# Patient Record
Sex: Male | Born: 1935 | Race: Black or African American | Hispanic: No | Smoking: Former smoker
Health system: Southern US, Community
[De-identification: ages and names within clinical notes are randomized; demographics above are authoritative.]

## PROBLEM LIST (undated history)

## (undated) DIAGNOSIS — I10 Essential (primary) hypertension: Secondary | ICD-10-CM

---

## 2003-04-11 ENCOUNTER — Other Ambulatory Visit: Payer: Self-pay

## 2004-08-05 ENCOUNTER — Emergency Department: Payer: Self-pay | Admitting: Emergency Medicine

## 2017-04-26 ENCOUNTER — Emergency Department
Admission: EM | Admit: 2017-04-26 | Discharge: 2017-04-26 | Disposition: A | Discharge status: Home / Self Care | Payer: Medicare HMO | Attending: Emergency Medicine | Admitting: Emergency Medicine

## 2017-04-26 ENCOUNTER — Other Ambulatory Visit: Payer: Self-pay

## 2017-04-26 ENCOUNTER — Encounter: Payer: Self-pay | Admitting: Emergency Medicine

## 2017-04-26 DIAGNOSIS — E871 Hypo-osmolality and hyponatremia: Secondary | ICD-10-CM | POA: Insufficient documentation

## 2017-04-26 DIAGNOSIS — R232 Flushing: Secondary | ICD-10-CM | POA: Diagnosis present

## 2017-04-26 DIAGNOSIS — Z87891 Personal history of nicotine dependence: Secondary | ICD-10-CM | POA: Insufficient documentation

## 2017-04-26 DIAGNOSIS — E876 Hypokalemia: Secondary | ICD-10-CM | POA: Insufficient documentation

## 2017-04-26 DIAGNOSIS — R030 Elevated blood-pressure reading, without diagnosis of hypertension: Secondary | ICD-10-CM | POA: Diagnosis not present

## 2017-04-26 LAB — CBC WITH DIFFERENTIAL/PLATELET
BASOS ABS: 0 10*3/uL (ref 0–0.1)
BASOS PCT: 1 %
EOS ABS: 0.1 10*3/uL (ref 0–0.7)
Eosinophils Relative: 3 %
HCT: 38.2 % — ABNORMAL LOW (ref 40.0–52.0)
HEMOGLOBIN: 13.1 g/dL (ref 13.0–18.0)
Lymphocytes Relative: 18 %
Lymphs Abs: 0.7 10*3/uL — ABNORMAL LOW (ref 1.0–3.6)
MCH: 28.6 pg (ref 26.0–34.0)
MCHC: 34.2 g/dL (ref 32.0–36.0)
MCV: 83.6 fL (ref 80.0–100.0)
MONOS PCT: 11 %
Monocytes Absolute: 0.4 10*3/uL (ref 0.2–1.0)
NEUTROS PCT: 67 %
Neutro Abs: 2.6 10*3/uL (ref 1.4–6.5)
Platelets: 145 10*3/uL — ABNORMAL LOW (ref 150–440)
RBC: 4.58 MIL/uL (ref 4.40–5.90)
RDW: 15.3 % — ABNORMAL HIGH (ref 11.5–14.5)
WBC: 3.9 10*3/uL (ref 3.8–10.6)

## 2017-04-26 LAB — COMPREHENSIVE METABOLIC PANEL
ALBUMIN: 4 g/dL (ref 3.5–5.0)
ALK PHOS: 86 U/L (ref 38–126)
ALT: 15 U/L — ABNORMAL LOW (ref 17–63)
ANION GAP: 9 (ref 5–15)
AST: 23 U/L (ref 15–41)
BUN: 18 mg/dL (ref 6–20)
CALCIUM: 8.7 mg/dL — AB (ref 8.9–10.3)
CO2: 25 mmol/L (ref 22–32)
Chloride: 99 mmol/L — ABNORMAL LOW (ref 101–111)
Creatinine, Ser: 0.89 mg/dL (ref 0.61–1.24)
GFR calc Af Amer: >60 mL/min (ref >60)
GFR calc non Af Amer: >60 mL/min (ref >60)
GLUCOSE: 103 mg/dL — AB (ref 65–99)
Potassium: 3.3 mmol/L — ABNORMAL LOW (ref 3.5–5.1)
SODIUM: 133 mmol/L — AB (ref 135–145)
Total Bilirubin: 1.4 mg/dL — ABNORMAL HIGH (ref 0.3–1.2)
Total Protein: 7.3 g/dL (ref 6.5–8.1)

## 2017-04-26 LAB — URINALYSIS, COMPLETE (UACMP) WITH MICROSCOPIC
Bilirubin Urine: NEGATIVE
Glucose, UA: NEGATIVE mg/dL
Hgb urine dipstick: NEGATIVE
KETONES UR: NEGATIVE mg/dL
LEUKOCYTES UA: NEGATIVE
Nitrite: NEGATIVE
PROTEIN: NEGATIVE mg/dL
Specific Gravity, Urine: 1.01 (ref 1.005–1.030)
pH: 7 (ref 5.0–8.0)

## 2017-04-26 LAB — TROPONIN I: Troponin I: <0.03 ng/mL (ref <0.03)

## 2017-04-26 MED ORDER — SODIUM CHLORIDE 0.9 % IV BOLUS (SEPSIS)
1000.0000 mL | Freq: Once | INTRAVENOUS | Status: AC
  Administered 2017-04-26: 1000 mL via INTRAVENOUS

## 2017-04-26 NOTE — Discharge Instructions (Signed)
Follow-up with your regular doctor if you are not better in 3 days.  Take 2 of your potassium pills a day instead of 1.  Please eat some salty foods to increase your sodium level.  Something like potato chips or add some salt to your food for the next few days.  If you are worsening please return to the emergency department.  If you just or not better see your doctor.

## 2017-04-26 NOTE — ED Triage Notes (Signed)
pt presents to ED accompanied by his son. Pt states he had gone to sleep this afternoon around 1600 and woke up at 1830 "feeling too hot" and his "eyes felt heavy". Pt denies pain. No cough, congestion, weakness, or fever. Pt currently has no other complaints. Pt states he knows he needs to drink more water because his "throat feels dry". Pt answering questions without difficulty at this time. No increased work of breathing or acute distress noted.  Pt states other then feeling dry in his mouth he is feeling all right.

## 2017-04-26 NOTE — ED Provider Notes (Signed)
[facility] Emergency Department Provider Note  ____________________________________________   First MD Initiated Contact with Patient 04/26/17 2126     (approximate)  I have reviewed the triage vital signs and the nursing notes.   HISTORY  Chief Complaint Hot Flashes    HPI Kenneth Butler is a 84 y.o. male who complains of feeling hot.  He states he went home and fell asleep on the side of his eyes felt heavy and then he woke up feeling like he was too hot.  He denies chest pain, shortness of breath, cold symptoms, vomiting or diarrhea.  He states he has been able to urinate without difficulty.  His family members have not noticed that he is acting any differently.  History reviewed. No pertinent past medical history.  There are no active problems to display for this patient.   History reviewed. No pertinent surgical history.  Prior to Admission medications   Not on File    Allergies Patient has no known allergies.  No family history on file.  Social History Social History   Tobacco Use  . Smoking status: Former Research scientist (life sciences)  . Smokeless tobacco: Never Used  Substance Use Topics  . Alcohol use: No    Frequency: Never  . Drug use: No    Review of Systems  Constitutional: No fever/chills, patient states he felt warm Eyes: No visual changes. ENT: No sore throat.  Denies runny nose or congestion Respiratory: Denies cough Cardiovascular: Denies chest pain or shortness of breath Gastrointestinal: Denies vomiting or diarrhea Genitourinary: Negative for dysuria. Musculoskeletal: Negative for back pain. Skin: Negative for rash.    ____________________________________________   PHYSICAL EXAM:  VITAL SIGNS: ED Triage Vitals  Enc Vitals Group     BP 04/26/17 2027 (!) 160/59     Pulse Rate 04/26/17 2027 69     Resp 04/26/17 2027 16     Temp 04/26/17 2027 98.9 F (37.2 C)     Temp Source 04/26/17 2027 Oral     SpO2 04/26/17 2027 98 %       Weight 04/26/17 2035 203 lb (92.1 kg)     Height 04/26/17 2035 '5\' 6"'$  (1.676 m)     Head Circumference --      Peak Flow --      Pain Score --      Pain Loc --      Pain Edu? --      Excl. in [place]? --     Constitutional: Alert and oriented. Well appearing and in no acute distress.  All questions in an appropriate manner Eyes: Conjunctivae are normal.  Head: Atraumatic. Nose: No congestion/rhinnorhea. Mouth/Throat: Mucous membranes are moist.  Throat is normal Cardiovascular: Normal rate, regular rhythm.  Heart sounds are normal Respiratory: Normal respiratory effort.  No retractions, lungs are clear to auscultation Abdomen: Is soft, nontender, bowel sounds are normal GU: deferred Musculoskeletal: FROM all extremities, warm and well perfused Neurologic:  Normal speech and language.  Skin:  Skin is warm, dry and intact. No rash noted. Psychiatric: Mood and affect are normal. Speech and behavior are normal.  ____________________________________________   LABS (all labs ordered are listed, but only abnormal results are displayed)  Labs Reviewed  CBC WITH DIFFERENTIAL/PLATELET - Abnormal; Notable for the following components:      Result Value   HCT 38.2 (*)    RDW 15.3 (*)    Platelets 145 (*)    Lymphs Abs 0.7 (*)    All other components within  normal limits  COMPREHENSIVE METABOLIC PANEL - Abnormal; Notable for the following components:   Sodium 133 (*)    Potassium 3.3 (*)    Chloride 99 (*)    Glucose, Bld 103 (*)    Calcium 8.7 (*)    ALT 15 (*)    Total Bilirubin 1.4 (*)    All other components within normal limits  URINALYSIS, COMPLETE (UACMP) WITH MICROSCOPIC - Abnormal; Notable for the following components:   Color, Urine YELLOW (*)    APPearance CLEAR (*)    Bacteria, UA RARE (*)    Squamous Epithelial / LPF 0-5 (*)    All other components within normal limits  TROPONIN I    ____________________________________________   ____________________________________________  RADIOLOGY    ____________________________________________   PROCEDURES  Procedure(s) performed: Saline lock, normal saline bolus 1 L  Procedures    ____________________________________________   INITIAL IMPRESSION / ASSESSMENT AND PLAN / ED COURSE  Pertinent labs & imaging results that were available during my care of the patient were reviewed by me and considered in my medical decision making (see chart for details).  Patient is an 84 year old male who presents emergency department because his eyes felt heavy and he felt hot.  He has no other complaints.  The family has not noticed him acting any differently.  Physical exam he appears well but elderly.  He does not move easily.  The remainder of the exam is benign  Labs are ordered, EKG is ordered, fluids were ordered  Patient CBC is normal, potassium is low at 3.3, his urinalysis is normal, EKG was ordered due to the low potassium, EKG is negative for STEMI.  EKG was read by Dr. Archie Balboa  Test results and EKG results were discussed with the family.  The patient already takes 16 M EQ's of potassium.  I instructed him to increase it to 20 M EQ's by taking 2 pills a day.  He states his doctor told him he might end up on 2 pills a day.  He is also eat some salty foods as his sodium is low.  Patient states he understands and will comply.  He was discharged in the care of his family.  They are to return if he is worsening.  He was discharged in stable condition     As part of my medical decision making, I reviewed the following data within the [place] History obtained from family, Nursing notes reviewed and incorporated, Labs reviewed CBC, met C showed low potassium and low sodium.  Troponin is negative.  Urinalysis is negative, EKG interpreted bradycardia, EKG was read by Dr. Archie Balboa, Old chart reviewed, Notes from  prior ED visits and [place] Controlled Substance Database  ____________________________________________   FINAL CLINICAL IMPRESSION(S) / ED DIAGNOSES  Final diagnoses:  Hypokalemia  Hyponatremia      NEW MEDICATIONS STARTED DURING THIS VISIT:  New Prescriptions   No medications on file     Note:  This document was prepared using Dragon voice recognition software and may include unintentional dictation errors.    Versie Starks, PA-C 04/26/17 2318    Harvest Dark, MD 04/26/17 [phone]

## 2017-04-26 NOTE — ED Triage Notes (Signed)
Pt reports woke up from a nap and felt warm

## 2017-04-26 NOTE — ED Notes (Signed)
Pts family states he woke up not feeling very well. He states he has had a little diarrhea and hasnt been drinking enough water. No c/o chest pain or fevers

## 2018-04-15 ENCOUNTER — Emergency Department: Payer: Medicare Other

## 2018-04-15 ENCOUNTER — Inpatient Hospital Stay
Admission: EM | Admit: 2018-04-15 | Discharge: 2018-04-16 | DRG: 193 | Disposition: A | Discharge status: Home Health | Payer: Medicare Other | Attending: Internal Medicine | Admitting: Internal Medicine

## 2018-04-15 ENCOUNTER — Encounter: Payer: Self-pay | Admitting: *Deleted

## 2018-04-15 ENCOUNTER — Other Ambulatory Visit: Payer: Self-pay

## 2018-04-15 DIAGNOSIS — M1712 Unilateral primary osteoarthritis, left knee: Secondary | ICD-10-CM | POA: Diagnosis not present

## 2018-04-15 DIAGNOSIS — E876 Hypokalemia: Secondary | ICD-10-CM | POA: Diagnosis present

## 2018-04-15 DIAGNOSIS — R4182 Altered mental status, unspecified: Secondary | ICD-10-CM | POA: Diagnosis present

## 2018-04-15 DIAGNOSIS — N390 Urinary tract infection, site not specified: Secondary | ICD-10-CM

## 2018-04-15 DIAGNOSIS — F0390 Unspecified dementia without behavioral disturbance: Secondary | ICD-10-CM | POA: Diagnosis present

## 2018-04-15 DIAGNOSIS — R778 Other specified abnormalities of plasma proteins: Secondary | ICD-10-CM

## 2018-04-15 DIAGNOSIS — Z87891 Personal history of nicotine dependence: Secondary | ICD-10-CM

## 2018-04-15 DIAGNOSIS — I10 Essential (primary) hypertension: Secondary | ICD-10-CM | POA: Diagnosis present

## 2018-04-15 DIAGNOSIS — J101 Influenza due to other identified influenza virus with other respiratory manifestations: Secondary | ICD-10-CM

## 2018-04-15 DIAGNOSIS — I248 Other forms of acute ischemic heart disease: Secondary | ICD-10-CM | POA: Diagnosis not present

## 2018-04-15 DIAGNOSIS — R7989 Other specified abnormal findings of blood chemistry: Secondary | ICD-10-CM

## 2018-04-15 DIAGNOSIS — R531 Weakness: Secondary | ICD-10-CM

## 2018-04-15 DIAGNOSIS — A419 Sepsis, unspecified organism: Secondary | ICD-10-CM

## 2018-04-15 DIAGNOSIS — A4189 Other specified sepsis: Secondary | ICD-10-CM | POA: Diagnosis present

## 2018-04-15 DIAGNOSIS — R509 Fever, unspecified: Secondary | ICD-10-CM

## 2018-04-15 HISTORY — DX: Essential (primary) hypertension: I10

## 2018-04-15 LAB — CBC WITH DIFFERENTIAL/PLATELET
ABS IMMATURE GRANULOCYTES: 0.02 10*3/uL (ref 0.00–0.07)
Basophils Absolute: 0 10*3/uL (ref 0.0–0.1)
Basophils Relative: 1 %
Eosinophils Absolute: 0 10*3/uL (ref 0.0–0.5)
Eosinophils Relative: 0 %
HEMATOCRIT: 39.7 % (ref 39.0–52.0)
Hemoglobin: 13 g/dL (ref 13.0–17.0)
IMMATURE GRANULOCYTES: 1 %
LYMPHS ABS: 0.1 10*3/uL — AB (ref 0.7–4.0)
LYMPHS PCT: 3 %
MCH: 28.8 pg (ref 26.0–34.0)
MCHC: 32.7 g/dL (ref 30.0–36.0)
MCV: 87.8 fL (ref 80.0–100.0)
MONOS PCT: 10 %
Monocytes Absolute: 0.5 10*3/uL (ref 0.1–1.0)
NEUTROS ABS: 3.7 10*3/uL (ref 1.7–7.7)
NEUTROS PCT: 85 %
Platelets: 144 10*3/uL — ABNORMAL LOW (ref 150–400)
RBC: 4.52 MIL/uL (ref 4.22–5.81)
RDW: 13.9 % (ref 11.5–15.5)
WBC: 4.3 10*3/uL (ref 4.0–10.5)
nRBC: 0 % (ref 0.0–0.2)

## 2018-04-15 LAB — COMPREHENSIVE METABOLIC PANEL
ALBUMIN: 3.8 g/dL (ref 3.5–5.0)
ALT: 17 U/L (ref 0–44)
AST: 45 U/L — AB (ref 15–41)
Alkaline Phosphatase: 61 U/L (ref 38–126)
Anion gap: 10 (ref 5–15)
BILIRUBIN TOTAL: 1.9 mg/dL — AB (ref 0.3–1.2)
BUN: 12 mg/dL (ref 8–23)
CO2: 27 mmol/L (ref 22–32)
CREATININE: 1.02 mg/dL (ref 0.61–1.24)
Calcium: 9 mg/dL (ref 8.9–10.3)
Chloride: 100 mmol/L (ref 98–111)
GFR calc Af Amer: >60 mL/min (ref >60)
GLUCOSE: 131 mg/dL — AB (ref 70–99)
Potassium: 2.9 mmol/L — ABNORMAL LOW (ref 3.5–5.1)
Sodium: 137 mmol/L (ref 135–145)
Total Protein: 7.2 g/dL (ref 6.5–8.1)

## 2018-04-15 LAB — URINALYSIS, COMPLETE (UACMP) WITH MICROSCOPIC
BACTERIA UA: NONE SEEN
BILIRUBIN URINE: NEGATIVE
Glucose, UA: NEGATIVE mg/dL
KETONES UR: NEGATIVE mg/dL
NITRITE: NEGATIVE
PROTEIN: NEGATIVE mg/dL
Specific Gravity, Urine: 1.016 (ref 1.005–1.030)
pH: 5 (ref 5.0–8.0)

## 2018-04-15 LAB — LACTIC ACID, PLASMA
Lactic Acid, Venous: 2.9 mmol/L (ref 0.5–1.9)
Lactic Acid, Venous: 1.6 mmol/L (ref 0.5–1.9)

## 2018-04-15 LAB — TROPONIN I
TROPONIN I: 0.23 ng/mL — AB (ref <0.03)
Troponin I: 0.17 ng/mL (ref <0.03)
Troponin I: 0.2 ng/mL (ref <0.03)
Troponin I: 0.19 ng/mL (ref <0.03)

## 2018-04-15 LAB — INFLUENZA PANEL BY PCR (TYPE A & B)
INFLAPCR: POSITIVE — AB
Influenza B By PCR: NEGATIVE

## 2018-04-15 LAB — TSH: TSH: 0.935 u[IU]/mL (ref 0.350–4.500)

## 2018-04-15 MED ORDER — ONDANSETRON HCL 4 MG/2ML IJ SOLN: 4.0000 mg | Freq: Four times a day (QID) | INTRAMUSCULAR | Status: DC | PRN

## 2018-04-15 MED ORDER — SODIUM CHLORIDE 0.9 % IV BOLUS (SEPSIS)
1000.0000 mL | Freq: Once | INTRAVENOUS | Status: AC
  Administered 2018-04-15: 1000 mL via INTRAVENOUS

## 2018-04-15 MED ORDER — ENOXAPARIN SODIUM 40 MG/0.4ML ~~LOC~~ SOLN
40.0000 mg | SUBCUTANEOUS | Status: DC
  Administered 2018-04-15: 40 mg via SUBCUTANEOUS
  Filled 2018-04-15: qty 0.4

## 2018-04-15 MED ORDER — ATENOLOL 50 MG PO TABS
50.0000 mg | ORAL_TABLET | Freq: Every day | ORAL | Status: DC
  Administered 2018-04-15 – 2018-04-16 (×2): 50 mg via ORAL
  Filled 2018-04-15 (×2): qty 1

## 2018-04-15 MED ORDER — DOCUSATE SODIUM 100 MG PO CAPS
100.0000 mg | ORAL_CAPSULE | Freq: Two times a day (BID) | ORAL | Status: DC
  Administered 2018-04-15 – 2018-04-16 (×2): 100 mg via ORAL
  Filled 2018-04-15 (×3): qty 1

## 2018-04-15 MED ORDER — SODIUM CHLORIDE 0.9 % IV BOLUS (SEPSIS)
500.0000 mL | Freq: Once | INTRAVENOUS | Status: AC
  Administered 2018-04-15: 500 mL via INTRAVENOUS

## 2018-04-15 MED ORDER — ACETAMINOPHEN 500 MG PO TABS
1000.0000 mg | ORAL_TABLET | Freq: Once | ORAL | Status: AC
  Administered 2018-04-15: 1000 mg via ORAL
  Filled 2018-04-15: qty 2

## 2018-04-15 MED ORDER — ACETAMINOPHEN 325 MG PO TABS
650.0000 mg | ORAL_TABLET | Freq: Four times a day (QID) | ORAL | Status: DC | PRN
  Administered 2018-04-15: 650 mg via ORAL
  Filled 2018-04-15: qty 2

## 2018-04-15 MED ORDER — SODIUM CHLORIDE 0.9 % IV SOLN
1.0000 g | INTRAVENOUS | Status: DC
  Administered 2018-04-15: 1 g via INTRAVENOUS
  Filled 2018-04-15 (×2): qty 10

## 2018-04-15 MED ORDER — ACETAMINOPHEN 650 MG RE SUPP: 650.0000 mg | Freq: Four times a day (QID) | RECTAL | Status: DC | PRN

## 2018-04-15 MED ORDER — ONDANSETRON HCL 4 MG PO TABS: 4.0000 mg | ORAL_TABLET | Freq: Four times a day (QID) | ORAL | Status: DC | PRN

## 2018-04-15 MED ORDER — ATENOLOL-CHLORTHALIDONE 50-25 MG PO TABS: 1.0000 | ORAL_TABLET | Freq: Every day | ORAL | Status: DC

## 2018-04-15 MED ORDER — POTASSIUM CHLORIDE CRYS ER 10 MEQ PO TBCR
10.0000 meq | EXTENDED_RELEASE_TABLET | Freq: Two times a day (BID) | ORAL | Status: DC
  Administered 2018-04-15 – 2018-04-16 (×3): 10 meq via ORAL
  Filled 2018-04-15 (×3): qty 1

## 2018-04-15 MED ORDER — CHLORTHALIDONE 25 MG PO TABS
25.0000 mg | ORAL_TABLET | Freq: Every day | ORAL | Status: DC
  Administered 2018-04-15 – 2018-04-16 (×2): 25 mg via ORAL
  Filled 2018-04-15 (×3): qty 1

## 2018-04-15 MED ORDER — POTASSIUM CHLORIDE CRYS ER 20 MEQ PO TBCR
40.0000 meq | EXTENDED_RELEASE_TABLET | ORAL | Status: AC
  Administered 2018-04-15: 40 meq via ORAL
  Filled 2018-04-15: qty 2

## 2018-04-15 MED ORDER — SODIUM CHLORIDE 0.9 % IV SOLN
INTRAVENOUS | Status: DC
  Administered 2018-04-15 (×2): via INTRAVENOUS

## 2018-04-15 MED ORDER — OSELTAMIVIR PHOSPHATE 75 MG PO CAPS
75.0000 mg | ORAL_CAPSULE | Freq: Two times a day (BID) | ORAL | Status: DC
  Administered 2018-04-15 – 2018-04-16 (×4): 75 mg via ORAL
  Filled 2018-04-15 (×4): qty 1

## 2018-04-15 MED ORDER — ASPIRIN 81 MG PO CHEW
324.0000 mg | CHEWABLE_TABLET | Freq: Once | ORAL | Status: AC
  Administered 2018-04-15: 324 mg via ORAL
  Filled 2018-04-15: qty 4

## 2018-04-15 NOTE — ED Notes (Signed)
Pt was in no distress, denies needed anything.  I noted that his depend was soiled and his gown and sheet was wet.  Pt changed into new depend, cleaned pt who had had a large BM up and placed in new gown and new sheets after bedbath.  Warm blankets given for comfort.  Some ice water offered.  Pt ate a [place] breakfast his family brought in

## 2018-04-15 NOTE — Progress Notes (Signed)
Patient ID: Kenneth Butler, male   DOB: 21-Jan-1931, 85 y.o.   MRN: [id number]  Patient seen chart reviewed. Admitted with generalized weakness and fever along with mild tachycardia. Noted to have influenza A positive. Started on Tamiflu. Received one dose of Rocephin in the ER. UA not impressive for UTI chest x-ray negative for pneumonia. I will DC antibiotic. Continue treatment of flu with symptomatic management and PRN Tylenol. Encourage oral fluids. Physical therapy if needed.

## 2018-04-15 NOTE — Plan of Care (Signed)
  Problem: Fluid Volume: Goal: Hemodynamic stability will improve Outcome: Progressing   Problem: Clinical Measurements: Goal: Diagnostic test results will improve Outcome: Progressing   Problem: Clinical Measurements: Goal: Signs and symptoms of infection will decrease Outcome: Not Progressing Note:  Patient positive for Flu A. Remains in droplet isolation. Will continue to monitor.   Problem: Clinical Measurements: Goal: Signs and symptoms of infection will decrease Outcome: Not Progressing Note:  Patient positive for Flu A. Remains in droplet isolation. Will continue to monitor.

## 2018-04-15 NOTE — H&P (Signed)
Kenneth Butler is an 85 y.o. male.   Chief Complaint: Weakness HPI: The patient with past medical history of hypertension presents to the emergency department via EMS from home complaining of weakness.  The patient also told EMS that he had a headache.  He is unable to provide further details.  He was found to be febrile as well as tachycardic.  Laboratory evaluation revealed hypokalemia as well as elevated troponin and increased lactic acid.  Sepsis protocol was initiated prior to the emergency department staff calling the hospitalist service for admission.  Past Medical History:  Diagnosis Date  . HTN (hypertension)   Possible dementia as well  No past surgical history on file.  The patient is somnolent and either does not remember surgeries or does not want to cooperate with interview  No family history on file.  The patient is somnolent and does not appear to want to participate with questions regarding family medical history  Social History:  reports that he has quit smoking. He has never used smokeless tobacco. He reports that he does not drink alcohol or use drugs.  Allergies: No Known Allergies  Prior to Admission medications   Medication Sig Start Date End Date Taking? Authorizing Provider  atenolol-chlorthalidone (TENORETIC) 50-25 MG tablet Take 1 tablet by mouth daily. 02/07/18  Yes [provider]  potassium chloride (K-DUR,KLOR-CON) 10 MEQ tablet Take 10 mEq by mouth 2 (two) times daily. 10/25/17  Yes [provider]     Results for orders placed or performed during the hospital encounter of 04/15/18 (from the past 48 hour(s))  Urinalysis, Complete w Microscopic     Status: Abnormal   Collection Time: 04/15/18 12:12 AM  Result Value Ref Range   Color, Urine YELLOW (A) YELLOW   APPearance CLEAR (A) CLEAR   Specific Gravity, Urine 1.016 1.005 - 1.030   pH 5.0 5.0 - 8.0   Glucose, UA NEGATIVE NEGATIVE mg/dL   Hgb urine dipstick MODERATE (A) NEGATIVE   Bilirubin Urine NEGATIVE NEGATIVE   Ketones, ur NEGATIVE NEGATIVE mg/dL   Protein, ur NEGATIVE NEGATIVE mg/dL   Nitrite NEGATIVE NEGATIVE   Leukocytes, UA TRACE (A) NEGATIVE   RBC / HPF 0-5 0 - 5 RBC/hpf   WBC, UA 6-10 0 - 5 WBC/hpf   Bacteria, UA NONE SEEN NONE SEEN   Squamous Epithelial / LPF 0-5 0 - 5   Mucus PRESENT     Comment: Performed at [facility], [address] [place], [place] [address]  Culture, blood (routine x 2)     Status: None (Preliminary result)   Collection Time: 04/15/18 12:16 AM  Result Value Ref Range   Specimen Description BLOOD LEFT ASSIST CONTROL    Special Requests      BOTTLES DRAWN AEROBIC AND ANAEROBIC Blood Culture adequate volume   Culture      NO GROWTH < 12 HOURS Performed at [facility], [address] [place], [place] [address]    Report Status PENDING   Culture, blood (routine x 2)     Status: None (Preliminary result)   Collection Time: 04/15/18 12:17 AM  Result Value Ref Range   Specimen Description BLOOD BLOOD RIGHT FOREARM    Special Requests      BOTTLES DRAWN AEROBIC AND ANAEROBIC Blood Culture adequate volume   Culture      NO GROWTH < 12 HOURS Performed at [facility], [address], [place], [place] [address]    Report Status PENDING   CBC  with Differential     Status: Abnormal   Collection Time: 04/15/18 12:17 AM  Result Value Ref Range   WBC 4.3 4.0 - 10.5 K/uL   RBC 4.52 4.22 - 5.81 MIL/uL   Hemoglobin 13.0 13.0 - 17.0 g/dL   HCT 16.1 09.6 - 04.5 %   MCV 87.8 80.0 - 100.0 fL   MCH 28.8 26.0 - 34.0 pg   MCHC 32.7 30.0 - 36.0 g/dL   RDW 40.9 81.1 - 91.4 %   Platelets 144 (L) 150 - 400 K/uL   nRBC 0.0 0.0 - 0.2 %   Neutrophils Relative % 85 %   Neutro Abs 3.7 1.7 - 7.7 K/uL   Lymphocytes Relative 3 %   Lymphs Abs 0.1 (L) 0.7 - 4.0 K/uL   Monocytes Relative 10 %   Monocytes Absolute 0.5 0.1 - 1.0 K/uL   Eosinophils Relative 0 %   Eosinophils Absolute 0.0 0.0 - 0.5 K/uL   Basophils  Relative 1 %   Basophils Absolute 0.0 0.0 - 0.1 K/uL   Immature Granulocytes 1 %   Abs Immature Granulocytes 0.02 0.00 - 0.07 K/uL    Comment: Performed at [facility], [address] [place], [place] [address]  Comprehensive metabolic panel     Status: Abnormal   Collection Time: 04/15/18 12:17 AM  Result Value Ref Range   Sodium 137 135 - 145 mmol/L   Potassium 2.9 (L) 3.5 - 5.1 mmol/L   Chloride 100 98 - 111 mmol/L   CO2 27 22 - 32 mmol/L   Glucose, Bld 131 (H) 70 - 99 mg/dL   BUN 12 8 - 23 mg/dL   Creatinine, Ser 6.21 0.61 - 1.24 mg/dL   Calcium 9.0 8.9 - 30.8 mg/dL   Total Protein 7.2 6.5 - 8.1 g/dL   Albumin 3.8 3.5 - 5.0 g/dL   AST 45 (H) 15 - 41 U/L   ALT 17 0 - 44 U/L   Alkaline Phosphatase 61 38 - 126 U/L   Total Bilirubin 1.9 (H) 0.3 - 1.2 mg/dL   GFR calc non Af Amer >60 >60 mL/min   GFR calc Af Amer >60 >60 mL/min   Anion gap 10 5 - 15    Comment: Performed at [facility], [address] [place], [place] [address]  Troponin I - Once     Status: Abnormal   Collection Time: 04/15/18 12:17 AM  Result Value Ref Range   Troponin I 0.23 (HH) <0.03 ng/mL    Comment: CRITICAL RESULT CALLED TO, READ BACK BY AND VERIFIED WITH AMY COWENS @0112  04/15/2018 TTG Performed at [facility], [address] [place], [place] [address]   Lactic acid, plasma     Status: Abnormal   Collection Time: 04/15/18 12:17 AM  Result Value Ref Range   Lactic Acid, Venous 2.9 (HH) 0.5 - 1.9 mmol/L    Comment: CRITICAL RESULT CALLED TO, READ BACK BY AND VERIFIED WITH AMY COWENS @0112  04/15/2018 TTG Performed at [facility], [address] [place], [place] [address]   Influenza panel by PCR (type A & B)     Status: Abnormal   Collection Time: 04/15/18 12:17 AM  Result Value Ref Range   Influenza A By PCR POSITIVE (A) NEGATIVE   Influenza B By PCR NEGATIVE NEGATIVE    Comment: (NOTE) The Xpert Xpress Flu assay is intended as an aid in the diagnosis  of  influenza and should not be used as a sole basis  for treatment.  This  assay is FDA approved for nasopharyngeal swab specimens only. Nasal  washings and aspirates are unacceptable for Xpert Xpress Flu testing. Performed at [facility], [address] [place], [place] [address]   Lactic acid, plasma     Status: None   Collection Time: 04/15/18  3:23 AM  Result Value Ref Range   Lactic Acid, Venous 1.6 0.5 - 1.9 mmol/L    Comment: Performed at [facility], [address] [place], [place] [address]   Ct Head Wo Contrast  Result Date: 04/15/2018 CLINICAL DATA:  Confusion EXAM: CT HEAD WITHOUT CONTRAST TECHNIQUE: Contiguous axial images were obtained from the base of the skull through the vertex without intravenous contrast. COMPARISON:  None. FINDINGS: Brain: No evidence of acute infarction, hemorrhage, extra-axial collection, ventriculomegaly, or mass effect. Generalized cerebral atrophy. Periventricular white matter low attenuation likely secondary to microangiopathy. Vascular: Cerebrovascular atherosclerotic calcifications are noted. Skull: Negative for fracture or focal lesion. Sinuses/Orbits: Visualized portions of the orbits are unremarkable. Visualized portions of the paranasal sinuses and mastoid air cells are unremarkable. Other: None. IMPRESSION: No acute intracranial pathology. Electronically Signed   By: Elige KoHetal  Patel   On: 04/15/2018 01:06   Dg Chest Port 1 View  Result Date: 04/15/2018 CLINICAL DATA:  Weakness, headaches EXAM: PORTABLE CHEST 1 VIEW COMPARISON:  None. FINDINGS: The heart size and mediastinal contours are within normal limits. Both lungs are clear. The visualized skeletal structures are unremarkable. IMPRESSION: No active disease. Electronically Signed   By: Elige KoHetal  Patel   On: 04/15/2018 01:07    Review of Systems  Constitutional: Negative for chills and fever.  HENT: Negative for sore throat and tinnitus.   Eyes: Negative for blurred vision  and redness.  Respiratory: Positive for shortness of breath. Negative for cough.   Cardiovascular: Negative for chest pain, palpitations, orthopnea and PND.  Gastrointestinal: Negative for abdominal pain, diarrhea, nausea and vomiting.  Genitourinary: Negative for dysuria, frequency and urgency.  Musculoskeletal: Negative for joint pain and myalgias.  Skin: Negative for rash.       No lesions  Neurological: Negative for speech change, focal weakness and weakness.  Endo/Heme/Allergies: Does not bruise/bleed easily.       No temperature intolerance  Psychiatric/Behavioral: Negative for depression and suicidal ideas.    Blood pressure 140/62, pulse (!) 118, temperature (!) 100.6 F (38.1 C), temperature source Rectal, resp. rate (!) 21, height 5\' 6"  (1.676 m), weight 81.6 kg, SpO2 100 %. Physical Exam  Vitals reviewed. Constitutional: He is oriented to person, place, and time. He appears well-developed and well-nourished. No distress.  HENT:  Head: Normocephalic and atraumatic.  Mouth/Throat: Oropharynx is clear and moist.  Eyes: Pupils are equal, round, and reactive to light. Conjunctivae and EOM are normal. No scleral icterus.  Neck: Normal range of motion. Neck supple. No JVD present. No tracheal deviation present. No thyromegaly present.  Cardiovascular: Normal rate, regular rhythm and normal heart sounds. Exam reveals no gallop and no friction rub.  No murmur heard. Respiratory: Effort normal and breath sounds normal. No respiratory distress.  GI: Soft. Bowel sounds are normal. He exhibits no distension. There is no abdominal tenderness.  Genitourinary:    Genitourinary Comments: Deferred   Musculoskeletal: Normal range of motion.        General: No edema.  Lymphadenopathy:    He has no cervical adenopathy.  Neurological: He is alert and oriented to person, place, and time. No cranial nerve deficit.  Skin: Skin is  warm and dry. No rash noted. No erythema.  Psychiatric: He has a  normal mood and affect. His behavior is normal. Judgment and thought content normal.     Assessment/Plan Is an 85 year old male admitted for sepsis. 1.  Sepsis: The patient meets criteria via fever, tachycardia and tachypnea.  The patient's lactic acid is also elevated.  Continue fluid resuscitation.  The patient is received a dose of ceftriaxone in the emergency department.  Follow blood cultures for growth and sensitivities.  The patient is hemodynamically stable.  Potential source is urine as the patient has some white blood cells but no bacteria or nitrites.  The patient is also positive for influenza. 2.  Influenza A: No evidence of pneumonia or encephalitis.  Contributes to symptoms of sepsis.  Continue Tamiflu 3.  Elevated troponin: Secondary to sepsis.  Continue to follow cardiac markers.  Monitor telemetry. 4.  Hypokalemia: Replete potassium.  No arrhythmias noted. 5.  Hypertension: Controlled; continue atenolol with chlorthalidone 6.  DVT prophylaxis: Lovenox 7.  GI prophylaxis: None The patient is a full code.  Time spent on admission orders and patient care approximately 45 minutes  Arnaldo Nataliamond,  Tayleigh Wetherell S, MD 04/15/2018, 8:00 AM

## 2018-04-15 NOTE — Progress Notes (Signed)
CODE SEPSIS - PHARMACY COMMUNICATION  **Broad Spectrum Antibiotics should be administered within 1 hour of Sepsis diagnosis**  Time Code Sepsis Called/Page Received: 2/7 @ 12:39   Antibiotics Ordered:   Ceftriaxone   Time of 1st antibiotic administration: 2/7 @ 0136   Additional action taken by pharmacy:   If necessary, Name of Provider/Nurse Contacted:     Boniface Goffe D ,PharmD Clinical Pharmacist  04/15/2018  1:40 AM

## 2018-04-15 NOTE — ED Notes (Signed)
Call to 2A, advised to call back in 15 minutes to give report

## 2018-04-15 NOTE — ED Notes (Signed)
Patient transported to CT 

## 2018-04-15 NOTE — ED Provider Notes (Signed)
[facility] Emergency Department Provider Note   ____________________________________________   First MD Initiated Contact with Patient 04/15/18 0010     (approximate)  I have reviewed the triage vital signs and the nursing notes.   HISTORY  Chief Complaint Weakness  Level V caveat: Limited by dementia  HPI Kenneth Butler is a 85 y.o. male brought to the ED via EMS from home with a chief complaint of generalized weakness  and altered mentation.  Reportedly son told EMS patient is too weak to stay at home.  Patient is pleasantly demented and unable to participate in much of the history.  Voices no complaints.   Past medical history Hypertension Hypokalemia  There are no active problems to display for this patient.   No past surgical history on file.  Prior to Admission medications   Medication Sig Start Date End Date Taking? Authorizing Provider  atenolol-chlorthalidone (TENORETIC) 50-25 MG tablet Take 1 tablet by mouth daily. 02/07/18  Yes [provider]  potassium chloride (K-DUR,KLOR-CON) 10 MEQ tablet Take 10 mEq by mouth 2 (two) times daily. 10/25/17  Yes [provider]    Allergies Patient has no known allergies.  No family history on file.  Social History Social History   Tobacco Use  . Smoking status: Former Games developermoker  . Smokeless tobacco: Never Used  Substance Use Topics  . Alcohol use: No    Frequency: Never  . Drug use: No    Review of Systems  Constitutional: Positive for generalized weakness.  No fever/chills Eyes: No visual changes. ENT: No sore throat. Cardiovascular: Denies chest pain. Respiratory: Denies shortness of breath. Gastrointestinal: No abdominal pain.  No nausea, no vomiting.  No diarrhea.  No constipation. Genitourinary: Negative for dysuria. Musculoskeletal: Negative for back pain. Skin: Negative for rash. Neurological: Positive for confusion.  Negative for headaches, focal weakness or  numbness.   ____________________________________________   PHYSICAL EXAM:  VITAL SIGNS: ED Triage Vitals  Enc Vitals Group     BP      Pulse      Resp      Temp      Temp src      SpO2      Weight      Height      Head Circumference      Peak Flow      Pain Score      Pain Loc      Pain Edu?      Excl. in GC?     Constitutional: Alert and oriented. Well appearing and in no acute distress. Eyes: Conjunctivae are normal. PERRL. EOMI. Head: Atraumatic. Nose: No congestion/rhinnorhea. Mouth/Throat: Mucous membranes are moist.  Oropharynx non-erythematous. Neck: No stridor.  Supple neck without meningismus. Cardiovascular: Tachycardic rate, regular rhythm. Grossly normal heart sounds.  Good peripheral circulation. Respiratory: Normal respiratory effort.  No retractions. Lungs CTAB. Gastrointestinal: Soft and nontender. No distention. No abdominal bruits. No CVA tenderness. Musculoskeletal: No lower extremity tenderness nor edema.  No joint effusions. Neurologic:  Normal speech and language. No gross focal neurologic deficits are appreciated.  Skin:  Skin is hot, dry and intact. No rash noted.  No petechiae. Psychiatric: Mood and affect are normal. Speech and behavior are normal.  ____________________________________________   LABS (all labs ordered are listed, but only abnormal results are displayed)  Labs Reviewed  CBC WITH DIFFERENTIAL/PLATELET - Abnormal; Notable for the following components:      Result Value   Platelets 144 (*)  Lymphs Abs 0.1 (*)    All other components within normal limits  COMPREHENSIVE METABOLIC PANEL - Abnormal; Notable for the following components:   Potassium 2.9 (*)    Glucose, Bld 131 (*)    AST 45 (*)    Total Bilirubin 1.9 (*)    All other components within normal limits  TROPONIN I - Abnormal; Notable for the following components:   Troponin I 0.23 (*)    All other components within normal limits  LACTIC ACID, PLASMA -  Abnormal; Notable for the following components:   Lactic Acid, Venous 2.9 (*)    All other components within normal limits  URINALYSIS, COMPLETE (UACMP) WITH MICROSCOPIC - Abnormal; Notable for the following components:   Color, Urine YELLOW (*)    APPearance CLEAR (*)    Hgb urine dipstick MODERATE (*)    Leukocytes, UA TRACE (*)    All other components within normal limits  INFLUENZA PANEL BY PCR (TYPE A & B) - Abnormal; Notable for the following components:   Influenza A By PCR POSITIVE (*)    All other components within normal limits  CULTURE, BLOOD (ROUTINE X 2)  CULTURE, BLOOD (ROUTINE X 2)  URINE CULTURE  LACTIC ACID, PLASMA   ____________________________________________  EKG  ED ECG REPORT I, , J, the attending physician, personally viewed and interpreted this ECG.   Date: 04/15/2018  EKG Time: 0019  Rate: 117  Rhythm: sinus tachycardia  Axis: Normal  Intervals:none  ST&T Change: Nonspecific  ____________________________________________  RADIOLOGY  ED MD interpretation: No ICH, no pneumonia  Official radiology report(s): Ct Head Wo Contrast  Result Date: 04/15/2018 CLINICAL DATA:  Confusion EXAM: CT HEAD WITHOUT CONTRAST TECHNIQUE: Contiguous axial images were obtained from the base of the skull through the vertex without intravenous contrast. COMPARISON:  None. FINDINGS: Brain: No evidence of acute infarction, hemorrhage, extra-axial collection, ventriculomegaly, or mass effect. Generalized cerebral atrophy. Periventricular white matter low attenuation likely secondary to microangiopathy. Vascular: Cerebrovascular atherosclerotic calcifications are noted. Skull: Negative for fracture or focal lesion. Sinuses/Orbits: Visualized portions of the orbits are unremarkable. Visualized portions of the paranasal sinuses and mastoid air cells are unremarkable. Other: None. IMPRESSION: No acute intracranial pathology. Electronically Signed   By: Elige KoHetal  Patel   On:  04/15/2018 01:06   Dg Chest Port 1 View  Result Date: 04/15/2018 CLINICAL DATA:  Weakness, headaches EXAM: PORTABLE CHEST 1 VIEW COMPARISON:  None. FINDINGS: The heart size and mediastinal contours are within normal limits. Both lungs are clear. The visualized skeletal structures are unremarkable. IMPRESSION: No active disease. Electronically Signed   By: Elige KoHetal  Patel   On: 04/15/2018 01:07    ____________________________________________   PROCEDURES  Procedure(s) performed: None  Procedures  Critical Care performed: Yes, see critical care note(s)   CRITICAL CARE Performed by: Irean Hong, J   Total critical care time: 45 minutes  Critical care time was exclusive of separately billable procedures and treating other patients.  Critical care was necessary to treat or prevent imminent or life-threatening deterioration.  Critical care was time spent personally by me on the following activities: development of treatment plan with patient and/or surrogate as well as nursing, discussions with consultants, evaluation of patient's response to treatment, examination of patient, obtaining history from patient or surrogate, ordering and performing treatments and interventions, ordering and review of laboratory studies, ordering and review of radiographic studies, pulse oximetry and re-evaluation of patient's condition.  ____________________________________________   INITIAL IMPRESSION / ASSESSMENT AND PLAN / ED COURSE  As part of my medical decision making, I reviewed the following data within the electronic MEDICAL RECORD NUMBER Nursing notes reviewed and incorporated, Labs reviewed, EKG interpreted, Old chart reviewed, Radiograph reviewed, Discussed with admitting physician and Notes from prior ED visits    85 year old male who presents to the ED with generalized weakness.  Found to be febrile and tachycardic.  ED code sepsis initiated.  Anticipate hospitalization.  Clinical Course as of Apr 15 118  Fri Apr 15, 2018  0117 Noted elevated lactate, troponin, trace leukocytes and influenza A+.  Will administer aspirin, initiate IV antibiotics, Tamiflu.  Will discuss with hospitalist Dr. Anne Hahn to evaluate patient in the emergency department for admission.   [JS]    Clinical Course User Index [JS] Irean Hong, MD     ____________________________________________   FINAL CLINICAL IMPRESSION(S) / ED DIAGNOSES  Final diagnoses:  Generalized weakness  Fever, unspecified fever cause  Lower urinary tract infectious disease  Sepsis, due to unspecified organism, unspecified whether acute organ dysfunction present (HCC)  Elevated troponin  Influenza A     ED Discharge Orders    None       Note:  This document was prepared using Dragon voice recognition software and may include unintentional dictation errors.    Irean Hong, MD 04/15/18 [phone]

## 2018-04-15 NOTE — ED Notes (Signed)
Pt was taken up by myself, gave him a lunch tray and NT on second floor agreed to get him set up

## 2018-04-15 NOTE — ED Triage Notes (Signed)
Pt brought in via ems from home with weakness.  Pt also has a headache.  Pt alert  Speech clear.

## 2018-04-15 NOTE — ED Notes (Signed)
Waiting for floor to get pm  No distress

## 2018-04-15 NOTE — ED Notes (Signed)
Pt is in no distress, waiting for floor to come get him

## 2018-04-15 NOTE — ED Notes (Signed)
Report off to kala rn 

## 2018-04-15 NOTE — ED Notes (Signed)
Family at the bedside.  Pt waiting for transport up, explained this to family

## 2018-04-16 DIAGNOSIS — R4182 Altered mental status, unspecified: Secondary | ICD-10-CM | POA: Diagnosis not present

## 2018-04-16 DIAGNOSIS — J101 Influenza due to other identified influenza virus with other respiratory manifestations: Secondary | ICD-10-CM | POA: Diagnosis not present

## 2018-04-16 LAB — BASIC METABOLIC PANEL
Anion gap: 5 (ref 5–15)
BUN: 9 mg/dL (ref 8–23)
CO2: 30 mmol/L (ref 22–32)
Calcium: 8.1 mg/dL — ABNORMAL LOW (ref 8.9–10.3)
Chloride: 104 mmol/L (ref 98–111)
Creatinine, Ser: 0.79 mg/dL (ref 0.61–1.24)
GFR calc non Af Amer: >60 mL/min (ref >60)
Glucose, Bld: 88 mg/dL (ref 70–99)
Potassium: 3.5 mmol/L (ref 3.5–5.1)
Sodium: 139 mmol/L (ref 135–145)

## 2018-04-16 LAB — URINE CULTURE: Culture: NO GROWTH

## 2018-04-16 MED ORDER — SODIUM CHLORIDE 0.9% FLUSH: 10.0000 mL | INTRAVENOUS | Status: DC | PRN

## 2018-04-16 MED ORDER — OSELTAMIVIR PHOSPHATE 75 MG PO CAPS: 75.0000 mg | ORAL_CAPSULE | Freq: Two times a day (BID) | ORAL | 0 refills | Status: AC

## 2018-04-16 MED ORDER — SODIUM CHLORIDE 0.9% FLUSH: 10.0000 mL | Freq: Two times a day (BID) | INTRAVENOUS | Status: DC

## 2018-04-16 NOTE — Discharge Summary (Signed)
[employer] [facility] [facility] [place] [facility]   PATIENT NAME: Kenneth Butler    MR#:  [id number]  DATE OF BIRTH:  1930-08-16  DATE OF ADMISSION:  04/15/2018 ADMITTING PHYSICIAN: Arnaldo Natal, MD  DATE OF DISCHARGE: 04/16/2018  PRIMARY CARE PHYSICIAN: Jaclyn Shaggy, MD    ADMISSION DIAGNOSIS:  Lower urinary tract infectious disease [N39.0] Influenza A [J10.1] Elevated troponin [R79.89] Generalized weakness [R53.1] Fever, unspecified fever cause [R50.9] Sepsis, due to unspecified organism, unspecified whether acute organ dysfunction present (HCC) [A41.9]  DISCHARGE DIAGNOSIS:  Active Problems:   Sepsis (HCC)   SECONDARY DIAGNOSIS:   Past Medical History:  Diagnosis Date  . HTN (hypertension)     HOSPITAL COURSE:  85 year old male with history of hypertension and left knee arthritis who presents to the emergency room due to generalized weakness.  1.  Sepsis: Patient presented with fever, tachycardia and tachypnea.  Sepsis is from influenza.  Sepsis is resolved.  2.  Influenza A: Patient will continue on Tamiflu for total 5 days.  3.  Elevated troponin due to demand ischemia.  Patient ruled out for ACS  4.  Hypokalemia: This was repleted  5.  Essential hypertension: Continue atenolol/chlorthalidone 6.  Left knee arthritis: Referral to orthopedic surgery outpatient   DISCHARGE CONDITIONS AND DIET:   Stable for discharge on regular diet  CONSULTS OBTAINED:    DRUG ALLERGIES:  No Known Allergies  DISCHARGE MEDICATIONS:   Allergies as of 04/16/2018   No Known Allergies     Medication List    TAKE these medications   atenolol-chlorthalidone 50-25 MG tablet Commonly known as:  TENORETIC Take 1 tablet by mouth daily.   oseltamivir 75 MG capsule Commonly known as:  TAMIFLU Take 1 capsule (75 mg total) by mouth 2 (two) times daily for 4 days.   potassium chloride 10 MEQ tablet Commonly known as:  K-DUR,KLOR-CON Take 10 mEq by mouth 2 (two) times  daily.         Today   CHIEF COMPLAINT:   sob improved No fevers overnight   VITAL SIGNS:  Blood pressure (!) 142/71, pulse 65, temperature 98.5 F (36.9 C), temperature source Oral, resp. rate (!) 24, height 5\' 6"  (1.676 m), weight 75.8 kg, SpO2 98 %.   REVIEW OF SYSTEMS:  Review of Systems  Constitutional: Negative.  Negative for chills, fever and malaise/fatigue.  HENT: Negative.  Negative for ear discharge, ear pain, hearing loss, nosebleeds and sore throat.   Eyes: Negative.  Negative for blurred vision and pain.  Respiratory: Negative.  Negative for cough, hemoptysis, shortness of breath and wheezing.   Cardiovascular: Negative.  Negative for chest pain, palpitations and leg swelling.  Gastrointestinal: Negative.  Negative for abdominal pain, blood in stool, diarrhea, nausea and vomiting.  Genitourinary: Negative.  Negative for dysuria.  Musculoskeletal: Positive for joint pain. Negative for back pain.  Skin: Negative.   Neurological: Negative for dizziness, tremors, speech change, focal weakness, seizures and headaches.  Endo/Heme/Allergies: Negative.  Does not bruise/bleed easily.  Psychiatric/Behavioral: Negative.  Negative for depression, hallucinations and suicidal ideas.     PHYSICAL EXAMINATION:  GENERAL:  85 y.o.-year-old patient lying in the bed with no acute distress.  NECK:  Supple, no jugular venous distention. No thyroid enlargement, no tenderness.  LUNGS: Normal breath sounds bilaterally, no wheezing, rales,rhonchi  No use of accessory muscles of respiration.  CARDIOVASCULAR: S1, S2 normal. No murmurs, rubs, or gallops.  ABDOMEN: Soft, non-tender, non-distended. Bowel sounds present. No organomegaly or mass.  EXTREMITIES: No pedal edema, cyanosis, or clubbing.  PSYCHIATRIC: The patient is alert and oriented x 3.  SKIN: No obvious rash, lesion, or ulcer.   DATA REVIEW:   CBC Recent Labs  Lab 04/15/18 0017  WBC 4.3  HGB 13.0  HCT 39.7  PLT  144*    Chemistries  Recent Labs  Lab 04/15/18 0017 04/16/18 0452  NA 137 139  K 2.9* 3.5  CL 100 104  CO2 27 30  GLUCOSE 131* 88  BUN 12 9  CREATININE 1.02 0.79  CALCIUM 9.0 8.1*  AST 45*  --   ALT 17  --   ALKPHOS 61  --   BILITOT 1.9*  --     Cardiac Enzymes Recent Labs  Lab 04/15/18 1209 04/15/18 1757 04/15/18 2259  TROPONINI 0.17* 0.20* 0.19*    Microbiology Results  @[email or web address] @  RADIOLOGY:  Ct Head Wo Contrast  Result Date: 04/15/2018 CLINICAL DATA:  Confusion EXAM: CT HEAD WITHOUT CONTRAST TECHNIQUE: Contiguous axial images were obtained from the base of the skull through the vertex without intravenous contrast. COMPARISON:  None. FINDINGS: Brain: No evidence of acute infarction, hemorrhage, extra-axial collection, ventriculomegaly, or mass effect. Generalized cerebral atrophy. Periventricular white matter low attenuation likely secondary to microangiopathy. Vascular: Cerebrovascular atherosclerotic calcifications are noted. Skull: Negative for fracture or focal lesion. Sinuses/Orbits: Visualized portions of the orbits are unremarkable. Visualized portions of the paranasal sinuses and mastoid air cells are unremarkable. Other: None. IMPRESSION: No acute intracranial pathology. Electronically Signed   By: Elige Ko   On: 04/15/2018 01:06   Dg Chest Port 1 View  Result Date: 04/15/2018 CLINICAL DATA:  Weakness, headaches EXAM: PORTABLE CHEST 1 VIEW COMPARISON:  None. FINDINGS: The heart size and mediastinal contours are within normal limits. Both lungs are clear. The visualized skeletal structures are unremarkable. IMPRESSION: No active disease. Electronically Signed   By: Elige Ko   On: 04/15/2018 01:07      Allergies as of 04/16/2018   No Known Allergies     Medication List    TAKE these medications   atenolol-chlorthalidone 50-25 MG tablet Commonly known as:  TENORETIC Take 1 tablet by mouth daily.   oseltamivir 75 MG capsule Commonly known as:   TAMIFLU Take 1 capsule (75 mg total) by mouth 2 (two) times daily for 4 days.   potassium chloride 10 MEQ tablet Commonly known as:  K-DUR,KLOR-CON Take 10 mEq by mouth 2 (two) times daily.           Management plans discussed with the patient and son and they are in agreement. Stable for discharge   Patient should follow up with pcp  CODE STATUS:     Code Status Orders  (From admission, onward)         Start     Ordered   04/15/18 1110  Full code  Continuous     04/15/18 1109        Code Status History    This patient has a current code status but no historical code status.      TOTAL TIME TAKING CARE OF THIS PATIENT: 38 minutes.    Note: This dictation was prepared with Dragon dictation along with smaller phrase technology. Any transcriptional errors that result from this process are unintentional.  Kenneth Butler M.D on 04/16/2018 at 1:18 PM  Between 7am to 6pm - Pager - [phone] After 6pm go to [email or web address] - Social research officer, government  [facility]  Office  [phone]  CC: Primary  care physician; Jaclyn Shaggyate, Denny C, MD

## 2018-04-16 NOTE — Plan of Care (Signed)
  Problem: Clinical Measurements: Goal: Diagnostic test results will improve Outcome: Progressing Goal: Signs and symptoms of infection will decrease Outcome: Progressing   Problem: Respiratory: Goal: Ability to maintain adequate ventilation will improve Outcome: Progressing   

## 2018-04-16 NOTE — Progress Notes (Signed)
Discharged to home with son.  Prescription for tamiflu given.  He has to make follow up appointments as it's Saturday.

## 2018-04-16 NOTE — Care Management Note (Signed)
Case Management Note  Patient Details  Name: Kenneth Butler MRN: [id number] Date of Birth: Aug 28, 1930  Subjective/Objective:  Patient to be discharged per MD order. Orders in place for home health services.Patient lives with his son and both are agreeable to home health services. Patient has walker and wheelchair in the home, denies further need for DME. CMS Medicare.gov Compare Post Acute Care list reviewed with patient and they prefer [facility]. Referral placed with J[facility] at [facility] to transport.  Buddy Duty RN BSN RNCM [phone]                   Action/Plan:   Expected Discharge Date:  04/16/18               Expected Discharge Plan:  Home w Home Health Services  In-House Referral:     Discharge planning Services  CM Consult  Post Acute Care Choice:  Home Health Choice offered to:  Patient, Adult Children  DME Arranged:    DME Agency:     HH Arranged:  RN, PT, Nurse's Aide HH Agency:  [facility]  Status of Service:  Completed, signed off  If discussed at Long Length of Stay Meetings, dates discussed:    Additional Comments:  Johnedward Brodrick A Riti Rollyson, RN 04/16/2018, 1:25 PM

## 2018-04-20 LAB — CULTURE, BLOOD (ROUTINE X 2)
CULTURE: NO GROWTH
Culture: NO GROWTH
SPECIAL REQUESTS: ADEQUATE
Special Requests: ADEQUATE

## 2018-04-24 ENCOUNTER — Telehealth: Payer: Self-pay | Admitting: Internal Medicine

## 2018-04-24 NOTE — Telephone Encounter (Signed)
04-22-18 CSW followed up with patient in regards of feeling sad hopeless anxious.  CSW spoke with patient, he stated that he feels like he is improving, he stated he has had some recent losses of loved ones.  Patient expressed that he feels like he is doing well, and he was glad to finally get back home.  Patient stated he is happy with the progress he is making, CSW signing off.  Ervin Knack. Johnisha Louks, MSW, Theresia Majors [phone]  04/22/18

## 2019-04-07 ENCOUNTER — Other Ambulatory Visit: Payer: Self-pay

## 2019-04-07 ENCOUNTER — Emergency Department
Admission: EM | Admit: 2019-04-07 | Discharge: 2019-04-10 | Disposition: A | Discharge status: Other Acute Inpatient Hospital | Payer: Medicare HMO | Attending: Emergency Medicine | Admitting: Emergency Medicine

## 2019-04-07 DIAGNOSIS — I10 Essential (primary) hypertension: Secondary | ICD-10-CM | POA: Diagnosis not present

## 2019-04-07 DIAGNOSIS — R4189 Other symptoms and signs involving cognitive functions and awareness: Secondary | ICD-10-CM | POA: Diagnosis present

## 2019-04-07 DIAGNOSIS — F22 Delusional disorders: Secondary | ICD-10-CM | POA: Diagnosis present

## 2019-04-07 DIAGNOSIS — Z20822 Contact with and (suspected) exposure to covid-19: Secondary | ICD-10-CM | POA: Insufficient documentation

## 2019-04-07 DIAGNOSIS — R419 Unspecified symptoms and signs involving cognitive functions and awareness: Secondary | ICD-10-CM | POA: Diagnosis present

## 2019-04-07 DIAGNOSIS — Z87891 Personal history of nicotine dependence: Secondary | ICD-10-CM | POA: Insufficient documentation

## 2019-04-07 DIAGNOSIS — R29818 Other symptoms and signs involving the nervous system: Secondary | ICD-10-CM | POA: Diagnosis present

## 2019-04-07 LAB — URINALYSIS, COMPLETE (UACMP) WITH MICROSCOPIC
Bilirubin Urine: NEGATIVE
Glucose, UA: NEGATIVE mg/dL
Hgb urine dipstick: NEGATIVE
Ketones, ur: NEGATIVE mg/dL
Leukocytes,Ua: NEGATIVE
Nitrite: NEGATIVE
Protein, ur: NEGATIVE mg/dL
Specific Gravity, Urine: 1.013 (ref 1.005–1.030)
pH: 7 (ref 5.0–8.0)

## 2019-04-07 LAB — CBC WITH DIFFERENTIAL/PLATELET
Abs Immature Granulocytes: 0.01 10*3/uL (ref 0.00–0.07)
Basophils Absolute: 0 10*3/uL (ref 0.0–0.1)
Basophils Relative: 1 %
Eosinophils Absolute: 0.1 10*3/uL (ref 0.0–0.5)
Eosinophils Relative: 2 %
HCT: 39 % (ref 39.0–52.0)
Hemoglobin: 12.9 g/dL — ABNORMAL LOW (ref 13.0–17.0)
Immature Granulocytes: 0 %
Lymphocytes Relative: 20 %
Lymphs Abs: 0.6 10*3/uL — ABNORMAL LOW (ref 0.7–4.0)
MCH: 27.9 pg (ref 26.0–34.0)
MCHC: 33.1 g/dL (ref 30.0–36.0)
MCV: 84.2 fL (ref 80.0–100.0)
Monocytes Absolute: 0.4 10*3/uL (ref 0.1–1.0)
Monocytes Relative: 12 %
Neutro Abs: 2 10*3/uL (ref 1.7–7.7)
Neutrophils Relative %: 65 %
Platelets: 162 10*3/uL (ref 150–400)
RBC: 4.63 MIL/uL (ref 4.22–5.81)
RDW: 13.7 % (ref 11.5–15.5)
WBC: 3 10*3/uL — ABNORMAL LOW (ref 4.0–10.5)
nRBC: 0 % (ref 0.0–0.2)

## 2019-04-07 LAB — TROPONIN I (HIGH SENSITIVITY): Troponin I (High Sensitivity): 6 ng/L (ref <18)

## 2019-04-07 LAB — COMPREHENSIVE METABOLIC PANEL
ALT: 14 U/L (ref 0–44)
AST: 23 U/L (ref 15–41)
Albumin: 3.6 g/dL (ref 3.5–5.0)
Alkaline Phosphatase: 50 U/L (ref 38–126)
Anion gap: 10 (ref 5–15)
BUN: 23 mg/dL (ref 8–23)
CO2: 28 mmol/L (ref 22–32)
Calcium: 9.1 mg/dL (ref 8.9–10.3)
Chloride: 95 mmol/L — ABNORMAL LOW (ref 98–111)
Creatinine, Ser: 1.07 mg/dL (ref 0.61–1.24)
GFR calc Af Amer: >60 mL/min (ref >60)
GFR calc non Af Amer: >60 mL/min (ref >60)
Glucose, Bld: 105 mg/dL — ABNORMAL HIGH (ref 70–99)
Potassium: 3.2 mmol/L — ABNORMAL LOW (ref 3.5–5.1)
Sodium: 133 mmol/L — ABNORMAL LOW (ref 135–145)
Total Bilirubin: 1.5 mg/dL — ABNORMAL HIGH (ref 0.3–1.2)
Total Protein: 7.2 g/dL (ref 6.5–8.1)

## 2019-04-07 LAB — URINE DRUG SCREEN, QUALITATIVE (ARMC ONLY)
Amphetamines, Ur Screen: NOT DETECTED
Barbiturates, Ur Screen: NOT DETECTED
Benzodiazepine, Ur Scrn: NOT DETECTED
Cannabinoid 50 Ng, Ur ~~LOC~~: NOT DETECTED
Cocaine Metabolite,Ur ~~LOC~~: NOT DETECTED
MDMA (Ecstasy)Ur Screen: NOT DETECTED
Methadone Scn, Ur: NOT DETECTED
Opiate, Ur Screen: NOT DETECTED
Phencyclidine (PCP) Ur S: NOT DETECTED
Tricyclic, Ur Screen: NOT DETECTED

## 2019-04-07 LAB — ETHANOL: Alcohol, Ethyl (B): <10 mg/dL (ref <10)

## 2019-04-07 NOTE — ED Notes (Signed)
[facility]  SOC  CALLED  INFORMED  RN  AMY

## 2019-04-07 NOTE — ED Notes (Signed)
Cardiac work up and repeat TRop DC'd as per convo with Dr Mayford Knife

## 2019-04-07 NOTE — ED Provider Notes (Addendum)
[facility] Emergency Department Provider Note       Time seen: ----------------------------------------- 5:33 PM on 04/07/2019 -----------------------------------------   I have reviewed the triage vital signs and the nursing notes.  HISTORY   Chief Complaint Psychiatric Evaluation    HPI Kenneth Butler is a 86 y.o. male with a history of hypertension, sepsis who presents to the ED for involuntary commitment.  According to report patient had an argument with his son and swatted at a staff member.  He denies suicidal homicidal ideations, arrives calm and appropriate.  Past Medical History:  Diagnosis Date  . HTN (hypertension)     Patient Active Problem List   Diagnosis Date Noted  . Sepsis ([place]) 04/15/2018    History reviewed. No pertinent surgical history.  Allergies Patient has no known allergies.  Social History Social History   Tobacco Use  . Smoking status: Former Research scientist (life sciences)  . Smokeless tobacco: Never Used  Substance Use Topics  . Alcohol use: No  . Drug use: No   Review of Systems Constitutional: Negative for fever. Cardiovascular: Negative for chest pain. Respiratory: Negative for shortness of breath. Gastrointestinal: Negative for abdominal pain, vomiting and diarrhea. Musculoskeletal: Negative for back pain. Skin: Negative for rash. Neurological: Negative for headaches, focal weakness or numbness.  All systems negative/normal/unremarkable except as stated in the HPI  ____________________________________________   PHYSICAL EXAM:  VITAL SIGNS: ED Triage Vitals  Enc Vitals Group     BP --      Pulse --      Resp --      Temp --      Temp src --      SpO2 --      Weight --      Height 04/07/19 1723 5\' 6"  (1.676 m)     Head Circumference --      Peak Flow --      Pain Score 04/07/19 1716 0     Pain Loc --      Pain Edu? --      Excl. in [place]? --    Constitutional: Alert, well appearing and in no distress. Eyes:  Conjunctivae are normal. Normal extraocular movements. ENT      Head: Normocephalic and atraumatic.      Nose: No congestion/rhinnorhea.      Mouth/Throat: Mucous membranes are moist.      Neck: No stridor. Cardiovascular: Normal rate, regular rhythm. No murmurs, rubs, or gallops. Respiratory: Normal respiratory effort without tachypnea nor retractions. Breath sounds are clear and equal bilaterally. No wheezes/rales/rhonchi. Gastrointestinal: Soft and nontender. Normal bowel sounds Musculoskeletal: Nontender with normal range of motion in extremities. No lower extremity tenderness nor edema. Neurologic:  Normal speech and language. No gross focal neurologic deficits are appreciated.  Skin:  Skin is warm, dry and intact. No rash noted. Psychiatric: Mood and affect are normal. Speech and behavior are normal.  ____________________________________________  ED COURSE:  As part of my medical decision making, I reviewed the following data within the [place] History obtained from family if available, nursing notes, old chart and ekg, as well as notes from prior ED visits. Patient presented for involuntary commitment, we will assess with labs as indicated at this time.   Procedures  Kenneth Butler was evaluated in Emergency Department on 04/07/2019 for the symptoms described in the history of present illness. He was evaluated in the context of the global COVID-19 pandemic, which necessitated consideration that the patient might be at risk for  infection with the SARS-CoV-2 virus that causes COVID-19. Institutional protocols and algorithms that pertain to the evaluation of patients at risk for COVID-19 are in a state of rapid change based on information released by regulatory bodies including the CDC and federal and state organizations. These policies and algorithms were followed during the patient's care in the ED.  ____________________________________________   LABS (pertinent  positives/negatives)  Labs Reviewed  CBC WITH DIFFERENTIAL/PLATELET - Abnormal; Notable for the following components:      Result Value   WBC 3.0 (*)    Hemoglobin 12.9 (*)    Lymphs Abs 0.6 (*)    All other components within normal limits  COMPREHENSIVE METABOLIC PANEL - Abnormal; Notable for the following components:   Sodium 133 (*)    Potassium 3.2 (*)    Chloride 95 (*)    Glucose, Bld 105 (*)    Total Bilirubin 1.5 (*)    All other components within normal limits  ETHANOL  URINALYSIS, COMPLETE (UACMP) WITH MICROSCOPIC  URINE DRUG SCREEN, QUALITATIVE ([facility] ONLY)  TROPONIN I (HIGH SENSITIVITY)   EKG: Interpreted by me, sinus rhythm rate 73 bpm, left axis deviation, septal infarct age-indeterminate, normal QT ____________________________________________   DIFFERENTIAL DIAGNOSIS   Involuntary commitment, dementia, occult infection, dehydration, medication side effect  FINAL ASSESSMENT AND PLAN  Involuntary commitment, dementia   Plan: The patient had presented for aggressive behavior. Patient's labs were grossly unremarkable.  He appears medically clear for psychiatric evaluation and disposition.   Ulice Dash, MD    Note: This note was generated in part or whole with voice recognition software. Voice recognition is usually quite accurate but there are transcription errors that can and very often do occur. I apologize for any typographical errors that were not detected and corrected.     Emily Filbert, MD 04/07/19 1759    Emily Filbert, MD 04/07/19 2003

## 2019-04-07 NOTE — ED Notes (Signed)
Pt dressed out by this tech:  Arts administrator of tan socks Black sweater [employer] under shirt Blue long sleeve shirt White long johns Gray sweat pants Blue ink pen Black flip cell phone (Pair of black glasses left with Pt.) Pt's wallet, change, 4 checks equaling up to $5924.64 given to hospital security, to be locked up in safe.

## 2019-04-07 NOTE — ED Notes (Signed)
Pt given hospital phone to make a call. Pt knew the number he wanted to call, phone number dialed by this tech.
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
## 2019-04-07 NOTE — ED Notes (Signed)
Pt has valuables locked in safe by security
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
## 2019-04-08 DIAGNOSIS — F22 Delusional disorders: Secondary | ICD-10-CM | POA: Diagnosis present

## 2019-04-08 DIAGNOSIS — R29818 Other symptoms and signs involving the nervous system: Secondary | ICD-10-CM | POA: Diagnosis present

## 2019-04-08 LAB — RESPIRATORY PANEL BY RT PCR (FLU A&B, COVID)
Influenza A by PCR: NEGATIVE
Influenza B by PCR: NEGATIVE
SARS Coronavirus 2 by RT PCR: NEGATIVE

## 2019-04-08 MED ORDER — QUETIAPINE FUMARATE 25 MG PO TABS
25.0000 mg | ORAL_TABLET | Freq: Every day | ORAL | Status: DC
  Administered 2019-04-08 – 2019-04-09 (×2): 25 mg via ORAL
  Filled 2019-04-08 (×2): qty 1

## 2019-04-08 MED ORDER — ATENOLOL-CHLORTHALIDONE 50-25 MG PO TABS: 1.0000 | ORAL_TABLET | Freq: Every day | ORAL | Status: DC

## 2019-04-08 MED ORDER — DIVALPROEX SODIUM 125 MG PO DR TAB
125.0000 mg | DELAYED_RELEASE_TABLET | Freq: Two times a day (BID) | ORAL | Status: DC
  Administered 2019-04-08 – 2019-04-09 (×3): 125 mg via ORAL
  Filled 2019-04-08 (×5): qty 1

## 2019-04-08 MED ORDER — ATENOLOL 25 MG PO TABS
50.0000 mg | ORAL_TABLET | Freq: Every day | ORAL | Status: DC
  Administered 2019-04-08 – 2019-04-09 (×2): 50 mg via ORAL
  Filled 2019-04-08 (×2): qty 2

## 2019-04-08 MED ORDER — POTASSIUM CHLORIDE CRYS ER 10 MEQ PO TBCR
10.0000 meq | EXTENDED_RELEASE_TABLET | Freq: Two times a day (BID) | ORAL | Status: DC
  Administered 2019-04-08 – 2019-04-09 (×4): 10 meq via ORAL
  Filled 2019-04-08 (×6): qty 1

## 2019-04-08 MED ORDER — CHLORTHALIDONE 25 MG PO TABS
25.0000 mg | ORAL_TABLET | Freq: Every day | ORAL | Status: DC
  Administered 2019-04-08 – 2019-04-09 (×2): 25 mg via ORAL
  Filled 2019-04-08 (×3): qty 1

## 2019-04-08 MED ORDER — FUROSEMIDE 20 MG PO TABS
20.0000 mg | ORAL_TABLET | Freq: Every day | ORAL | Status: DC
  Administered 2019-04-08 – 2019-04-09 (×2): 20 mg via ORAL
  Filled 2019-04-08 (×3): qty 1

## 2019-04-08 NOTE — ED Provider Notes (Signed)
-----------------------------------------   6:18 AM on 04/08/2019 -----------------------------------------   Blood pressure (!) 146/71, pulse 78, temperature 98.7 F (37.1 C), temperature source Oral, resp. rate 18, height 5\' 6"  (1.676 m), SpO2 98 %.  The patient is calm and cooperative at this time.  There have been no acute events since the last update.  Awaiting disposition plan from Behavioral Medicine and/or Social Work team(s).   , MD 04/08/19 [phone]

## 2019-04-08 NOTE — ED Notes (Signed)
Spoke with nurse from [facility] giving them info regarding them possibly taking this pt today.

## 2019-04-08 NOTE — ED Notes (Signed)
Referral information for Psychiatric Hospitalization faxed to;   . [facility] ([phone]-or- [phone]),   . [facility] [phone] -or- [phone]) [phone]  . [facility] ([phone]),   . [facility] ([phone] -or- [phone]),   . [facility] ([phone] or [phone])  . [facility] ([phone] or [phone]),   . [facility] ([phone]).  . [facility] ([phone])   

## 2019-04-08 NOTE — BH Assessment (Signed)
Patient has been accepted to [facility].  Patient assigned to Geriatric Unit Accepting physician is Dr. Kevan Ny.  Call report to [phone].  Representative was Cat.   ER Staff is aware of it: Ronnie, ER Secretary  Dr. Roxan Hockey, ER MD  Darl Pikes, Patient's Nurse     Patient's Family/Support System [facility] [place] [phone]) have been updated as well.

## 2019-04-08 NOTE — ED Notes (Signed)
Pt assisted to the toilet for a bm. Depends changed at that time as well.

## 2019-04-08 NOTE — BH Assessment (Addendum)
TTS called [place] to inform transportation to facility will be available Monday (04/10/2019) per ED secretary Christen Bame.   They have agreed to hold his bed until Monday (per Cat, Intake Clinician).

## 2019-04-08 NOTE — ED Notes (Signed)
Pt assisted with urinal at bedside.  

## 2019-04-08 NOTE — ED Notes (Signed)
Reports to Dr Eulah Pont, [facility], att

## 2019-04-08 NOTE — ED Notes (Signed)
Pt voided into urinal.

## 2019-04-08 NOTE — ED Notes (Signed)
Pt "didn't make it in time"; incontinent of urine, bedding and diaper changed

## 2019-04-09 NOTE — ED Notes (Signed)
Hourly rounding reveals patient in room. No complaints, stable, in no acute distress. Q15 minute rounds and monitoring via Rover and Officer to continue.   

## 2019-04-09 NOTE — ED Notes (Signed)
Pt ambulated to bathroom with walker with this tech and Kim,RN.

## 2019-04-09 NOTE — ED Provider Notes (Signed)
-----------------------------------------   6:35 AM on 04/09/2019 -----------------------------------------   Blood pressure (!) 116/54, pulse 67, temperature 98 F (36.7 C), temperature source Oral, resp. rate 18, height 5\' 6"  (1.676 m), SpO2 100 %.  The patient is sleeping at this time.  There have been no acute events since the last update.  Awaiting disposition plan from Behavioral Medicine and/or Social Work team(s).   , MD 04/09/19 [phone]

## 2019-04-09 NOTE — ED Notes (Signed)
Hourly rounding reveals patient sleeping in room. No complaints, stable, in no acute distress. Q15 minute rounds and monitoring via Rover and Officer to continue.  

## 2019-04-09 NOTE — ED Notes (Signed)
Pt given lunch tray.

## 2019-04-09 NOTE — ED Notes (Signed)
Pt lying down asleep in bed, equal unlabored RR, skin warm & dry. Nothing needed from staff at this time. Will continue to monitor  

## 2019-04-09 NOTE — ED Notes (Signed)
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

## 2019-04-09 NOTE — ED Notes (Signed)
Report to include situation, background, assessment and recommendations from [employer] Patient sleeping, respirations regular and unlabored. Q15 minute rounds and Officer observation to continue.

## 2019-04-10 NOTE — ED Notes (Signed)
Hourly rounding reveals patient sleeping in room. No complaints, stable, in no acute distress. Q15 minute rounds and monitoring via Rover and Officer to continue.  

## 2019-04-10 NOTE — ED Notes (Signed)
Pt discharged under IVC to [facility]. Pt's son made aware of transfer. VS stable. All belongings (including money locked in safe) given to officer. Pt calm and cooperative.

## 2019-04-10 NOTE — ED Notes (Signed)
Hourly rounding reveals patient awake in room. No complaints, stable, in no acute distress. Q15 minute rounds and monitoring via Rover and Officer to continue.  

## 2019-04-10 NOTE — ED Notes (Signed)
Pt assisted with urinal. Pt now back laying in bed NAD.

## 2019-04-10 NOTE — ED Notes (Signed)
[facility]  DEPT  CALLED  FOR   TRANSPORT  TO   [facility]

## 2019-04-10 NOTE — ED Notes (Addendum)
Hourly rounding reveals patient sleeping in room. No complaints, stable, in no acute distress. Q15 minute rounds and monitoring via Rover and Officer to continue.  

## 2019-04-10 NOTE — ED Provider Notes (Signed)
-----------------------------------------   5:20 AM on 04/10/2019 -----------------------------------------   Blood pressure (!) 125/58, pulse 67, temperature 97.6 F (36.4 C), temperature source Oral, resp. rate 16, height 1.676 m (5\' 6" ), SpO2 99 %.  The patient is calm and cooperative at this time.  The patient is to be transferred to an outside facility today.   , MD 04/10/19 [phone]

## 2020-04-06 IMAGING — CT CT HEAD W/O CM
3 series · 15 of 46 positions shown, 18 images · non-contrast
Comparison: None.

CLINICAL DATA: Confusion

EXAM:
CT HEAD WITHOUT CONTRAST
TECHNIQUE: Contiguous axial images were obtained from the base of the skull
through the vertex without intravenous contrast.

[Series 3: head wo · axial · 0.43mm/px · z∈[-86,+34]mm · 9 of 29 slices shown, 12 images]
[im 3/29  brain]
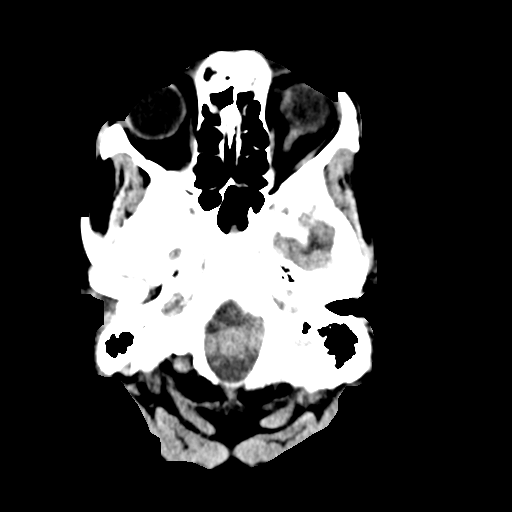
[im 3/29  bone]
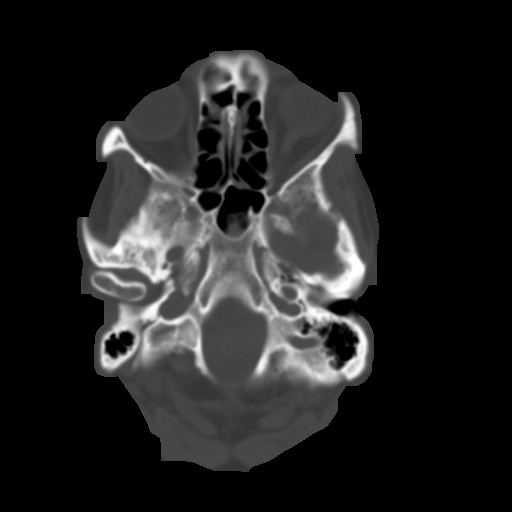
[im 6/29  brain]
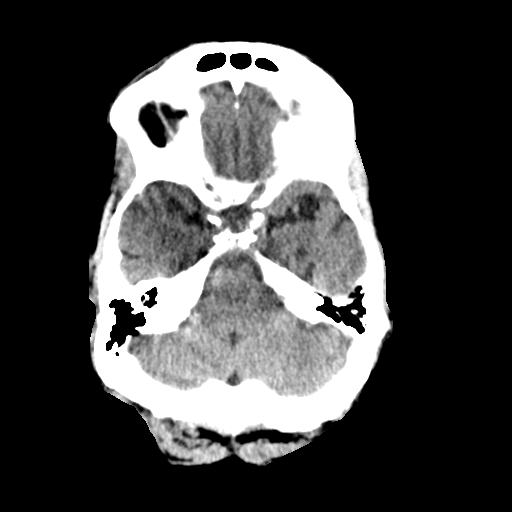
[im 9/29  brain]
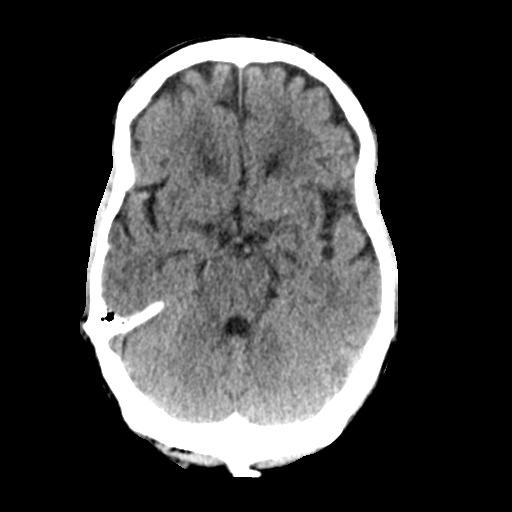
[im 12/29  brain]
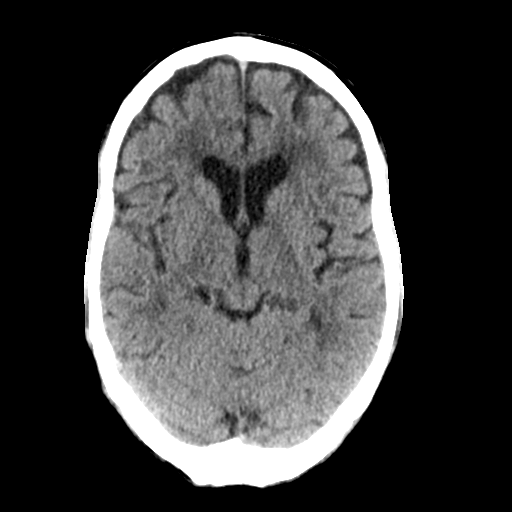
[im 15/29  brain]
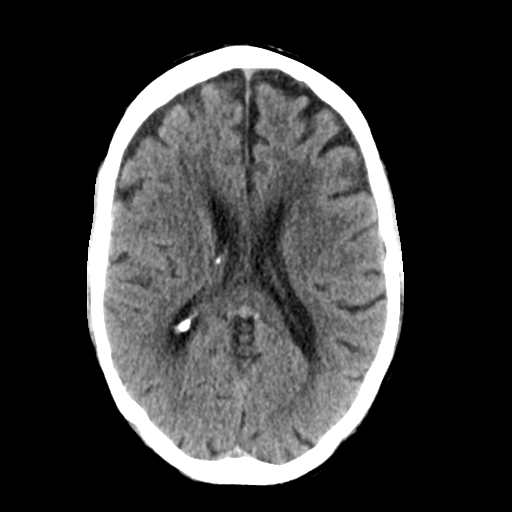
[im 15/29  bone]
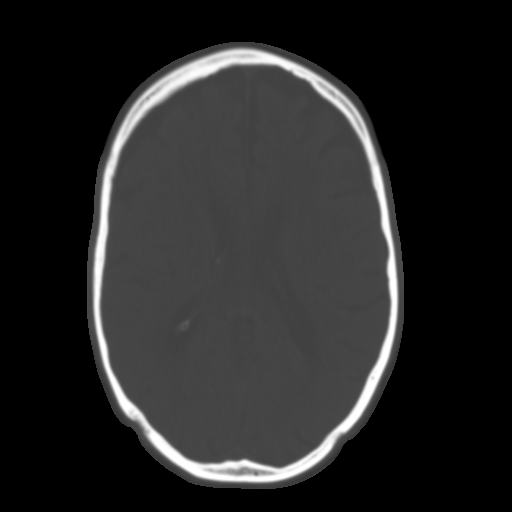
[im 18/29  brain]
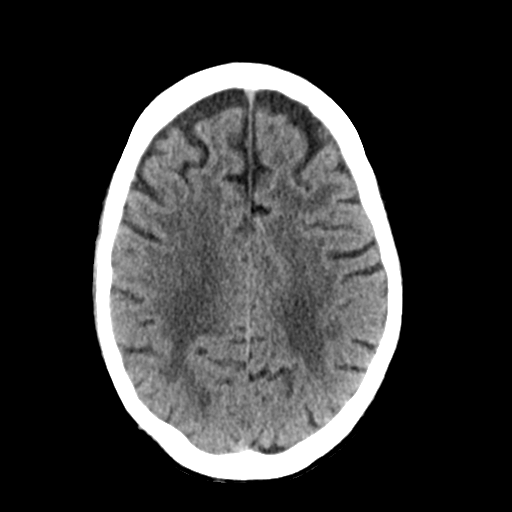
[im 21/29  brain]
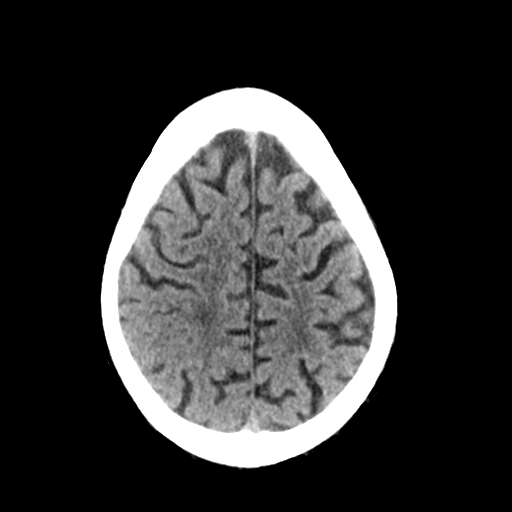
[im 24/29  brain]
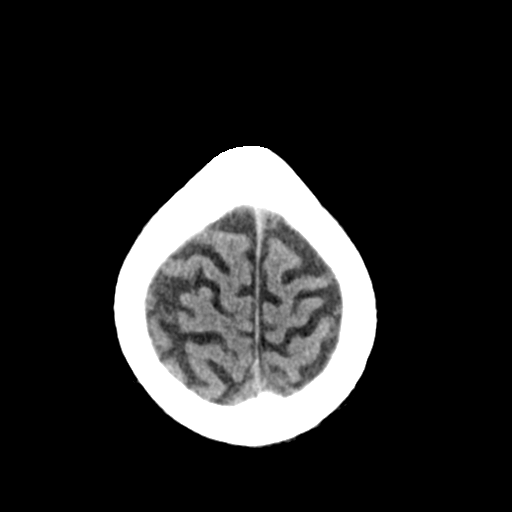
[im 27/29  brain]
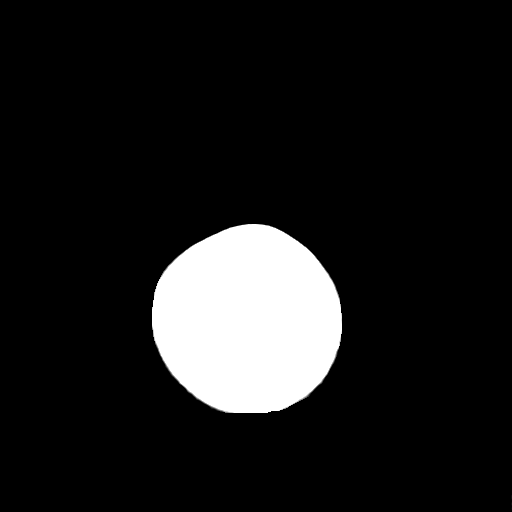
[im 27/29  bone]
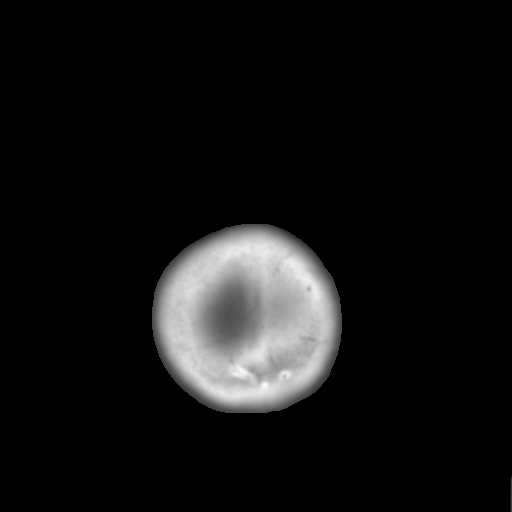

[Series 4: coronal soft tissue · coronal · 0.29mm/px · 3 of 64 slices shown]
[im 22/64  brain]
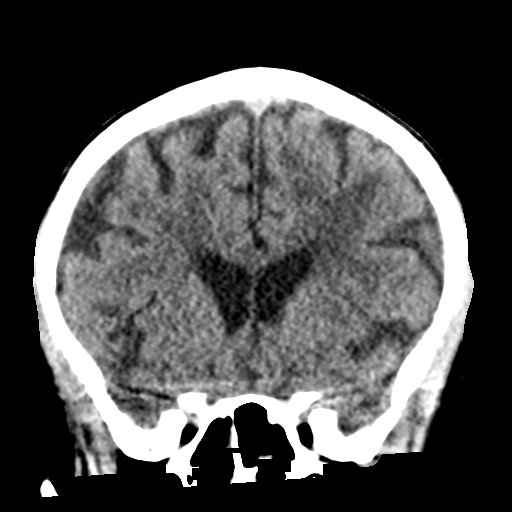
[im 29/64  brain]
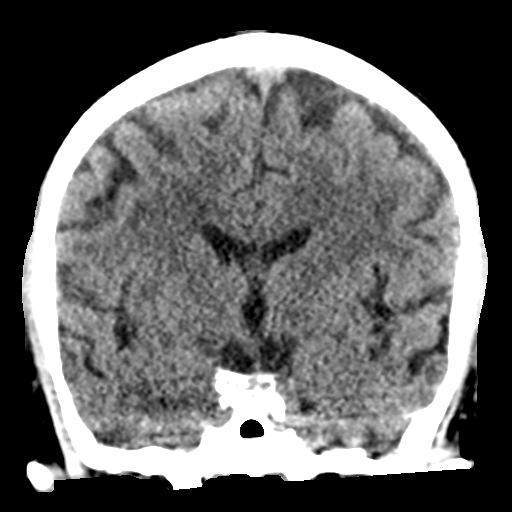
[im 36/64  brain]
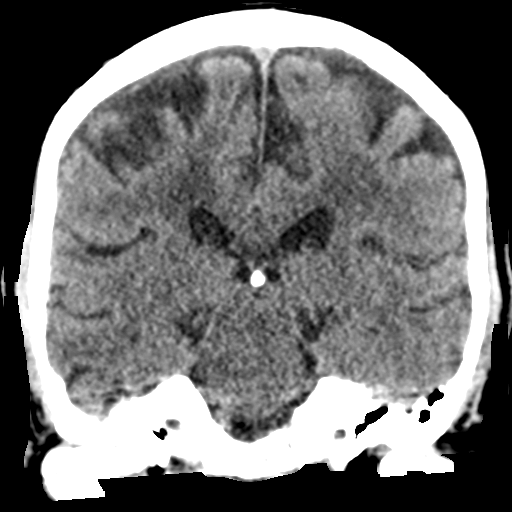

[Series 5: sagittal soft tissue · sagittal · 0.28mm/px · 3 of 48 slices shown]
[im 16/48  brain]
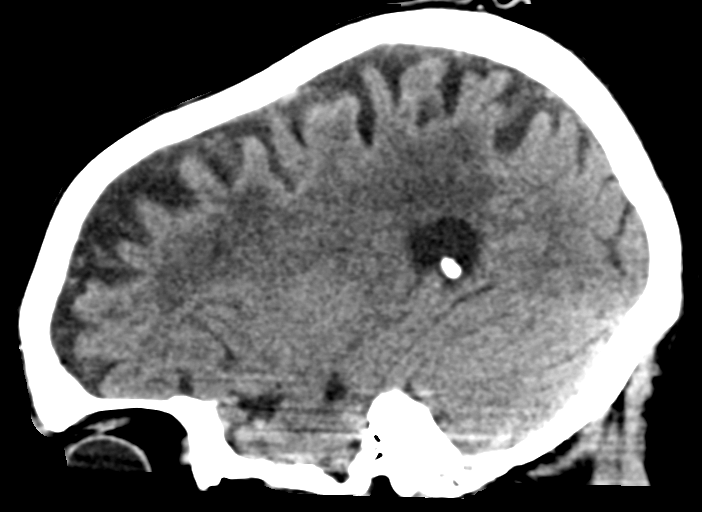
[im 24/48  brain]
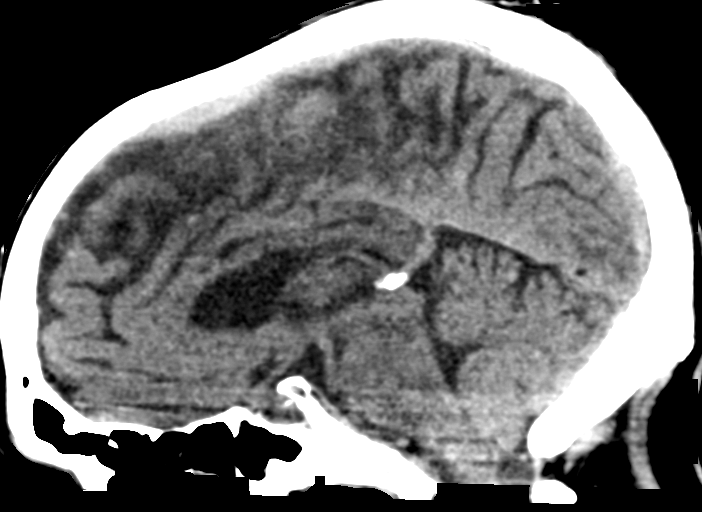
[im 32/48  brain]
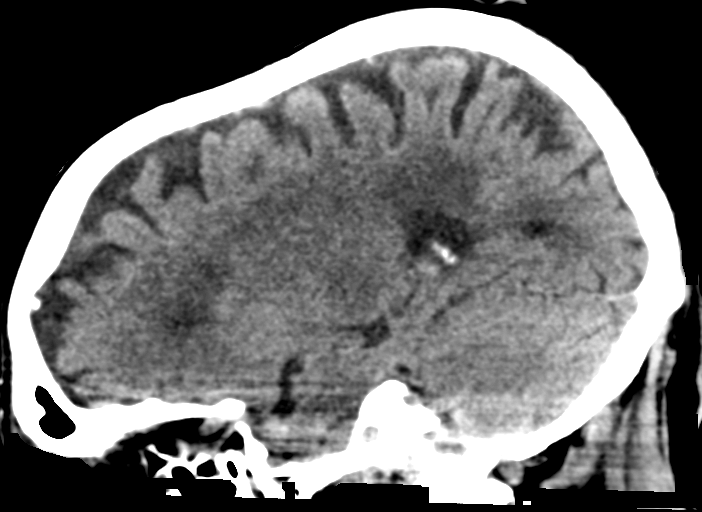

[15 of 46 positions shown; findings below may reference images not displayed]

FINDINGS: Brain: No evidence of acute infarction, hemorrhage, extra-axial
collection, ventriculomegaly, or mass effect. Generalized cerebral
atrophy. Periventricular white matter low attenuation likely
secondary to microangiopathy.

Vascular: Cerebrovascular atherosclerotic calcifications are noted.

Skull: Negative for fracture or focal lesion.

Sinuses/Orbits: Visualized portions of the orbits are unremarkable.
Visualized portions of the paranasal sinuses and mastoid air cells
are unremarkable.

Other: None.
IMPRESSION: No acute intracranial pathology.

## 2020-11-07 DEATH — deceased
